# Patient Record
Sex: Male | Born: 1975 | Race: Black or African American | Hispanic: No | Marital: Married | Smoking: Never smoker
Health system: Southern US, Community
[De-identification: ages and names within clinical notes are randomized; demographics above are authoritative.]

## PROBLEM LIST (undated history)

## (undated) ENCOUNTER — Emergency Department (HOSPITAL_COMMUNITY): Disposition: A | Discharge status: Home / Self Care | Payer: Self-pay

## (undated) DIAGNOSIS — R358 Other polyuria: Secondary | ICD-10-CM

## (undated) DIAGNOSIS — M25579 Pain in unspecified ankle and joints of unspecified foot: Secondary | ICD-10-CM

## (undated) DIAGNOSIS — I4510 Unspecified right bundle-branch block: Secondary | ICD-10-CM

## (undated) DIAGNOSIS — M25571 Pain in right ankle and joints of right foot: Secondary | ICD-10-CM

## (undated) DIAGNOSIS — S92109A Unspecified fracture of unspecified talus, initial encounter for closed fracture: Secondary | ICD-10-CM

## (undated) HISTORY — DX: Unspecified right bundle-branch block: I45.10

---
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
## 2002-12-12 ENCOUNTER — Encounter: Admission: RE | Admit: 2002-12-12 | Discharge: 2002-12-12 | Payer: Self-pay | Admitting: Sports Medicine

## 2005-02-15 ENCOUNTER — Ambulatory Visit: Payer: Self-pay | Admitting: Sports Medicine

## 2008-02-08 ENCOUNTER — Ambulatory Visit: Payer: Self-pay | Admitting: Sports Medicine

## 2008-02-08 DIAGNOSIS — M25579 Pain in unspecified ankle and joints of unspecified foot: Secondary | ICD-10-CM

## 2008-02-08 DIAGNOSIS — S92109A Unspecified fracture of unspecified talus, initial encounter for closed fracture: Secondary | ICD-10-CM

## 2008-02-08 HISTORY — DX: Unspecified fracture of unspecified talus, initial encounter for closed fracture: S92.109A

## 2008-02-08 HISTORY — DX: Pain in unspecified ankle and joints of unspecified foot: M25.579

## 2010-01-06 ENCOUNTER — Ambulatory Visit: Payer: Self-pay | Admitting: Family Medicine

## 2010-01-06 DIAGNOSIS — R3589 Other polyuria: Secondary | ICD-10-CM

## 2010-01-06 DIAGNOSIS — R358 Other polyuria: Secondary | ICD-10-CM | POA: Insufficient documentation

## 2010-01-06 HISTORY — DX: Other polyuria: R35.89

## 2010-04-20 NOTE — Assessment & Plan Note (Signed)
Summary: NP,TCB   Vital Signs:  Patient profile:   35 year old male Height:      67.3 inches Weight:      206 pounds BMI:     32.09 Temp:     98.1 degrees F oral Pulse rate:   56 / minute Pulse rhythm:   regular BP sitting:   130 / 82  (left arm) Cuff size:   large  Vitals Entered By: Loralee Pacas CMA (January 06, 2010 2:42 PM) CC: NEW PATIENT physical   CC:  NEW PATIENT physical.  History of Present Illness: Hasn't seen MD in years.   Generally healthy and no complaints. Has not had cholesterol checked.  Also concerned for pos FHx of DM and he has some polyuria  Habits & Providers  Alcohol-Tobacco-Diet     Alcohol drinks/day: <1     Tobacco Status: never     Diet Comments: healthy     Diet Counseling: not indicated; diet is assessed to be healthy  Exercise-Depression-Behavior     Does Patient Exercise: no     Exercise Counseling: to improve exercise regimen     Type of exercise: strength and cardio     Times/week: <3     Have you felt down or hopeless? no     Have you felt little pleasure in things? no     Depression Counseling: not indicated; screening negative for depression     STD Risk: never     Drug Use: never     Seat Belt Use: always     Sun Exposure: infrequent  Current Medications (verified): 1)  Loratadine 10 Mg  Tabs (Loratadine) .... Once Daily As Needed Allergies Otc  Allergies (verified): No Known Drug Allergies  Past History:  Family History: Last updated: 01/06/2010 Family History Diabetes 1st degree relative lupus mother - for HBP, CAD, CVA  Social History: Last updated: 01/06/2010 Works at [employer]  Risk Factors: Alcohol Use: <1 (01/06/2010) Diet: healthy (01/06/2010) Exercise: no (01/06/2010)  Risk Factors: Smoking Status: never (01/06/2010)  Past Medical History: No significant previous medical problems.  Past Surgical History: none  Family History: Family History Diabetes 1st degree relative lupus  mother - for HBP, CAD, CVA  Social History: Works at [employer] Status:  never Does Patient Exercise:  no STD Risk:  never Drug Use:  never Risk analyst Use:  always Sun Exposure-Excessive:  infrequent  Review of Systems  The patient denies chest pain, syncope, dyspnea on exertion, peripheral edema, prolonged cough, headaches, abdominal pain, melena, severe indigestion/heartburn, suspicious skin lesions, depression, and unusual weight change.    Physical Exam  General:  Well-developed,well-nourished,in no acute distress; alert,appropriate and cooperative throughout examination Mouth:  Oral mucosa and oropharynx without lesions or exudates.  Teeth in good repair. Neck:  No deformities, masses, or tenderness noted. Lungs:  Normal respiratory effort, chest expands symmetrically. Lungs are clear to auscultation, no crackles or wheezes. Heart:  Normal rate and regular rhythm. S1 and S2 normal without gallop, murmur, click, rub or other extra sounds. Abdomen:  Bowel sounds positive,abdomen soft and non-tender without masses, organomegaly or hernias noted. Msk:  No deformity or scoliosis noted of thoracic or lumbar spine.   Extremities:  No clubbing, cyanosis, edema, or deformity noted with normal full range of motion of all joints.   Neurologic:  No cranial nerve deficits noted. Station and gait are normal. Plantar reflexes are down-going bilaterally. DTRs are symmetrical throughout. Sensory, motor and coordinative functions appear intact.  Skin:  Intact without suspicious lesions or rashes   Impression & Recommendations:  Problem # 1:  Preventive Health Care (ICD-V70.0) Healthy patient with no serious family history and no problematic life style issues.  BMI of 32 is high, so weight loss is the major recommendation.  BP checks q6 month.  Problem # 2:  SCREENING FOR LIPOID DISORDERS (ICD-V77.91) Will check FLP Future Orders: Lipid-[facility] ([id number]) ...  12/23/2010  Problem # 3:  POLYURIA (ZOX-096.04)  Future Orders: Basic Met-[facility] (54098-11914) ... 12/23/2010  Complete Medication List: 1)  Loratadine 10 Mg Tabs (Loratadine) .... Once daily as needed allergies otc Prescriptions: LORATADINE 10 MG  TABS (LORATADINE) once daily as needed allergies OTC  #30 x 12   Entered and Authorized by:   Doralee Albino MD   Signed by:   Doralee Albino MD on 01/06/2010   Method used:   Historical   RxID:   [id number]    Orders Added: 1)  Lipid-[facility] [[id number]] 2)  Basic Met-[facility] [[id number]] 3)  [facility]- New 18-74yrs [[id number]]     Prevention & Chronic Care Immunizations   Influenza vaccine: Not documented    Tetanus booster: Not documented    Pneumococcal vaccine: Not documented  Other Screening   Smoking status: never  (01/06/2010)  Lipids   Total Cholesterol: Not documented   LDL: Not documented   LDL Direct: Not documented   HDL: Not documented   Triglycerides: Not documented

## 2013-12-20 ENCOUNTER — Encounter (INDEPENDENT_AMBULATORY_CARE_PROVIDER_SITE_OTHER): Payer: Self-pay

## 2013-12-20 ENCOUNTER — Ambulatory Visit (INDEPENDENT_AMBULATORY_CARE_PROVIDER_SITE_OTHER): Payer: 59 | Admitting: Family Medicine

## 2013-12-20 ENCOUNTER — Ambulatory Visit (HOSPITAL_BASED_OUTPATIENT_CLINIC_OR_DEPARTMENT_OTHER)
Admission: RE | Admit: 2013-12-20 | Discharge: 2013-12-20 | Disposition: A | Discharge status: Home / Self Care | Payer: 59 | Source: Ambulatory Visit | Attending: Family Medicine | Admitting: Family Medicine

## 2013-12-20 ENCOUNTER — Encounter: Payer: Self-pay | Admitting: Family Medicine

## 2013-12-20 VITALS — BP 135/88 | HR 47 | Ht 67.0 in | Wt 202.0 lb

## 2013-12-20 DIAGNOSIS — M25571 Pain in right ankle and joints of right foot: Secondary | ICD-10-CM

## 2013-12-20 DIAGNOSIS — M25471 Effusion, right ankle: Secondary | ICD-10-CM | POA: Diagnosis not present

## 2013-12-20 NOTE — Patient Instructions (Signed)
Your pain and location are unusual. It does look like you have an avulsion fracture here but it appears old - there's no swelling over the area of the fracture or increased blood flow to suggest this just happened. These can be aggravated by activity occasionally. I would treat conservatively for 2 weeks Ice the area for 15 minutes at a time, 3-4 times a day Aleve 2 tabs twice a day with food OR ibuprofen 3 tabs three times a day with food for pain and inflammation. Elevate above the level of your heart when possible if swollen. Crutches if needed to help with walking Consider laceup brace or short boot when up and walking around. Follow up with me in 2 weeks for reevaluation.

## 2013-12-23 ENCOUNTER — Encounter: Payer: Self-pay | Admitting: Family Medicine

## 2013-12-23 DIAGNOSIS — M25571 Pain in right ankle and joints of right foot: Secondary | ICD-10-CM

## 2013-12-23 HISTORY — DX: Pain in right ankle and joints of right foot: M25.571

## 2013-12-23 NOTE — Assessment & Plan Note (Signed)
radiographs show avulsion fracture but this appears to be old.  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  No evidence gout, stress fracture.  Start with conservative treatment - icing, nsaids, elevation, avoiding running.  Short cam walker.  F/u in 2 weeks for reevaluation.

## 2013-12-23 NOTE — Progress Notes (Signed)
Patient ID: Roy CoddingtonDavid Stanley, male   DOB: 04-Jul-1975, 38 y.o.   MRN: [id number]  PCP: No primary provider on file.  Subjective:   HPI: Patient is a 38 y.o. male here for right ankle pain.  Patient denies known injury though has history of multiple ankle sprains past 20 years. States he ran on Saturday 10 miles - Sunday woke up with medial pain, swelling in right ankle. Still having pain though swelling has improved. Has been icing, took ibuprofen. No history of gout. No redness, warmth. No pain following his run or during his run on Saturday.  History reviewed. No pertinent past medical history.  Current Outpatient Prescriptions on File Prior to Visit  Medication Sig Dispense Refill  . loratadine (CLARITIN) 10 MG tablet Take 10 mg by mouth daily as needed. For allergies. OTC.        No current facility-administered medications on file prior to visit.    History reviewed. No pertinent past surgical history.  No Known Allergies  History   Social History  . Marital Status: Married    Spouse Name: N/A    Number of Children: N/A  . Years of Education: N/A   Occupational History  . Not on file.   Social History Main Topics  . Smoking status: Never Smoker   . Smokeless tobacco: Not on file  . Alcohol Use: Not on file  . Drug Use: Not on file  . Sexual Activity: Not on file   Other Topics Concern  . Not on file   Social History Narrative  . No narrative on file    No family history on file.  BP 135/88  Pulse 47  Ht 5\' 7"  (1.702 m)  Wt 202 lb (91.627 kg)  BMI 31.63 kg/m2  Review of Systems: See HPI above.    Objective:  Physical Exam:  Gen: NAD  Right ankle/foot: No gross deformity, swelling, ecchymoses FROM TTP mildly anteromedial ankle joint over anterior aspect deltoid ligament also.  No focal bony tenderness of malleoli, base 5th, navicular. Negative talar tilt, reverse talar tilt, ant drawer. Negative syndesmotic compression. Thompsons test  negative. NV intact distally.    Assessment & Plan:  1. Right ankle pain - radiographs show avulsion fracture but this appears to be old.  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  No evidence gout, stress fracture.  Start with conservative treatment - icing, nsaids, elevation, avoiding running.  Short cam walker.  F/u in 2 weeks for reevaluation.

## 2014-01-03 ENCOUNTER — Encounter: Payer: Self-pay | Admitting: Family Medicine

## 2014-01-03 ENCOUNTER — Ambulatory Visit (INDEPENDENT_AMBULATORY_CARE_PROVIDER_SITE_OTHER): Payer: 59 | Admitting: Family Medicine

## 2014-01-03 VITALS — BP 127/82 | HR 51 | Ht 67.0 in | Wt 200.0 lb

## 2014-01-03 DIAGNOSIS — M25571 Pain in right ankle and joints of right foot: Secondary | ICD-10-CM

## 2014-01-07 ENCOUNTER — Encounter: Payer: Self-pay | Admitting: Family Medicine

## 2014-01-07 NOTE — Progress Notes (Signed)
Patient ID: Joanie CoddingtonDavid Bastin, male   DOB: [birth year], 38 y.o.   MRN: [id number]  PCP: No primary provider on file.  Subjective:   HPI: Patient is a 38 y.o. male here for right ankle pain.  10/2: Patient denies known injury though has history of multiple ankle sprains past 20 years. States he ran on Saturday 10 miles - Sunday woke up with medial pain, swelling in right ankle. Still having pain though swelling has improved. Has been icing, took ibuprofen. No history of gout. No redness, warmth. No pain following his run or during his run on Saturday.  10/16: Patient reports he feels over 90% improved compared to last visit. A little pain but no worse than 2/10. Stopped using boot a few days ago. No swelling or bruising. No new injuries.  History reviewed. No pertinent past medical history.  Current Outpatient Prescriptions on File Prior to Visit  Medication Sig Dispense Refill  . loratadine (CLARITIN) 10 MG tablet Take 10 mg by mouth daily as needed. For allergies. OTC.        No current facility-administered medications on file prior to visit.    History reviewed. No pertinent past surgical history.  No Known Allergies  History   Social History  . Marital Status: Married    Spouse Name: N/A    Number of Children: N/A  . Years of Education: N/A   Occupational History  . Not on file.   Social History Main Topics  . Smoking status: Never Smoker   . Smokeless tobacco: Not on file  . Alcohol Use: Not on file  . Drug Use: Not on file  . Sexual Activity: Not on file   Other Topics Concern  . Not on file   Social History Narrative  . No narrative on file    No family history on file.  BP 127/82  Pulse 51  Ht 5\' 7"  (1.702 m)  Wt 200 lb (90.719 kg)  BMI 31.32 kg/m2  Review of Systems: See HPI above.    Objective:  Physical Exam:  Gen: NAD  Right ankle/foot: No gross deformity, swelling, ecchymoses FROM No longer with TTP anteromedial ankle joint over  anterior aspect deltoid ligament.  No focal bony tenderness of malleoli, base 5th, navicular. Negative talar tilt, reverse talar tilt, ant drawer. Negative syndesmotic compression. Thompsons test negative. NV intact distally.    Assessment & Plan:  1. Right ankle pain - radiographs showed avulsion fracture but this appears to be old.  Most consistent with flare of underlying arthropathy he has here.  No evidence gout, stress fracture.  Over 90% improved - discussed progression back to sports, activities.  Icing, nsaids, elevation if needed.  F/u prn.

## 2014-01-07 NOTE — Assessment & Plan Note (Signed)
radiographs showed avulsion fracture but this appears to be old.  Most consistent with flare of underlying arthropathy he has here.  No evidence gout, stress fracture.  Over 90% improved - discussed progression back to sports, activities.  Icing, nsaids, elevation if needed.  F/u prn.

## 2014-01-12 ENCOUNTER — Emergency Department (HOSPITAL_COMMUNITY)
Admission: EM | Admit: 2014-01-12 | Discharge: 2014-01-12 | Disposition: A | Discharge status: Home / Self Care | Payer: 59 | Source: Home / Self Care | Attending: Family Medicine | Admitting: Family Medicine

## 2014-01-12 ENCOUNTER — Encounter (HOSPITAL_COMMUNITY): Payer: Self-pay | Admitting: Emergency Medicine

## 2014-01-12 DIAGNOSIS — L03112 Cellulitis of left axilla: Secondary | ICD-10-CM

## 2014-01-12 MED ORDER — MINOCYCLINE HCL 100 MG PO CAPS: 100.0000 mg | ORAL_CAPSULE | Freq: Two times a day (BID) | ORAL | Status: DC

## 2014-01-12 NOTE — ED Provider Notes (Signed)
CSN: [id number]     Arrival date & time 01/12/14  1041 History   First MD Initiated Contact with Patient 01/12/14 1100     Chief Complaint  Patient presents with  . Arm Pain   (Consider location/radiation/quality/duration/timing/severity/associated sxs/prior Treatment) Patient is a 38 y.o. male presenting with arm pain. The history is provided by the patient.  Arm Pain This is a new problem. The current episode started 2 days ago (left axillary soreness, spreading to pectoral chest and inner arm.). The problem has been gradually improving. Pertinent negatives include no chest pain and no abdominal pain.    History reviewed. No pertinent past medical history. History reviewed. No pertinent past surgical history. History reviewed. No pertinent family history. History  Substance Use Topics  . Smoking status: Never Smoker   . Smokeless tobacco: Not on file  . Alcohol Use: No    Review of Systems  Constitutional: Negative.   Cardiovascular: Negative for chest pain.  Gastrointestinal: Negative for abdominal pain.  Skin: Positive for rash.    Allergies  Review of patient's allergies indicates no known allergies.  Home Medications   Prior to Admission medications   Medication Sig Start Date End Date Taking? Authorizing Provider  loratadine (CLARITIN) 10 MG tablet Take 10 mg by mouth daily as needed. For allergies. OTC.     Historical Provider, MD  minocycline (MINOCIN,DYNACIN) 100 MG capsule Take 1 capsule (100 mg total) by mouth 2 (two) times daily. 01/12/14   Linna HoffJames D Saul Dorsi, MD   BP 115/72  Pulse 59  Temp(Src) 97.6 F (36.4 C) (Oral)  Resp 12  SpO2 100% Physical Exam  Nursing note and vitals reviewed. Constitutional: He is oriented to person, place, and time. He appears well-developed and well-nourished. No distress.  Neurological: He is alert and oriented to person, place, and time.  Skin: Skin is warm and dry. Rash noted. There is erythema.  Mild left axillary skin sts  and tenderness and erythema. No discrete rash evident.    ED Course  Procedures (including critical care time) Labs Review Labs Reviewed - No data to display  Imaging Review No results found.   MDM   1. Cellulitis of left axilla        Linna HoffJames D Jae Skeet, MD 01/12/14 1122

## 2014-01-12 NOTE — ED Notes (Signed)
Reports having pain in left axillary, acute on set.  States by Saturday pain radiating down left arm and noticed bumps under left arm.  Denies chest pain or sob.

## 2014-01-12 NOTE — Discharge Instructions (Signed)
Warm compress twice a day when you take the antibiotic, take all of medicine, return as needed. °

## 2017-05-12 ENCOUNTER — Encounter: Payer: Self-pay | Admitting: Internal Medicine

## 2018-11-01 ENCOUNTER — Telehealth (HOSPITAL_COMMUNITY): Payer: Self-pay | Admitting: Radiology

## 2018-11-01 ENCOUNTER — Ambulatory Visit (HOSPITAL_COMMUNITY): Payer: 59 | Attending: Cardiovascular Disease

## 2018-11-01 ENCOUNTER — Other Ambulatory Visit: Payer: Self-pay

## 2018-11-01 ENCOUNTER — Other Ambulatory Visit (HOSPITAL_COMMUNITY): Payer: Self-pay | Admitting: Family Medicine

## 2018-11-01 ENCOUNTER — Encounter (INDEPENDENT_AMBULATORY_CARE_PROVIDER_SITE_OTHER): Payer: Self-pay

## 2018-11-01 DIAGNOSIS — R9431 Abnormal electrocardiogram [ECG] [EKG]: Secondary | ICD-10-CM

## 2018-11-01 DIAGNOSIS — I4510 Unspecified right bundle-branch block: Secondary | ICD-10-CM | POA: Diagnosis present

## 2018-11-01 DIAGNOSIS — R6889 Other general symptoms and signs: Secondary | ICD-10-CM

## 2018-11-01 NOTE — Telephone Encounter (Signed)
Unable to leave detail message due to [facility]. Left message to call back. Patient needs to schedule echocardiogram. Urgent per ordering physician.

## 2018-11-06 ENCOUNTER — Encounter: Payer: Self-pay | Admitting: *Deleted

## 2018-11-06 ENCOUNTER — Other Ambulatory Visit: Payer: Self-pay

## 2018-11-06 ENCOUNTER — Encounter: Payer: Self-pay | Admitting: Cardiology

## 2018-11-06 ENCOUNTER — Ambulatory Visit: Payer: 59 | Admitting: Cardiology

## 2018-11-06 VITALS — BP 120/72 | HR 55 | Ht 67.0 in | Wt 212.4 lb

## 2018-11-06 DIAGNOSIS — R0609 Other forms of dyspnea: Secondary | ICD-10-CM

## 2018-11-06 DIAGNOSIS — I4510 Unspecified right bundle-branch block: Secondary | ICD-10-CM

## 2018-11-06 DIAGNOSIS — R9431 Abnormal electrocardiogram [ECG] [EKG]: Secondary | ICD-10-CM | POA: Diagnosis not present

## 2018-11-06 DIAGNOSIS — I209 Angina pectoris, unspecified: Secondary | ICD-10-CM

## 2018-11-06 DIAGNOSIS — Z01812 Encounter for preprocedural laboratory examination: Secondary | ICD-10-CM | POA: Diagnosis not present

## 2018-11-06 MED ORDER — METOPROLOL TARTRATE 100 MG PO TABS: 100.0000 mg | ORAL_TABLET | Freq: Once | ORAL | 0 refills | Status: AC

## 2018-11-06 NOTE — Progress Notes (Signed)
Cardiology Office Note:    Date:  11/06/2018   ID:  Roy [place], DOB 10/16/1975, MRN [id number]  PCP:  Glenis Smoker, MD  Cardiologist:  No primary care provider on file.  Electrophysiologist:  None   Referring MD: Glenis Smoker, *   Here for the evaluation of abnormal EKG at the request of Dr. Lindell Noe  History of Present Illness:    Roy [place] is a 43 y.o. male with a hx of right bundle branch block here for the evaluation of abnormal EKG, right bundle branch block.  Also has been having decreased exercise tolerance.  An echocardiogram was performed on 11/01/2018 with the following results:   1. The left ventricle has normal systolic function, with an ejection fraction of 55-60%. The cavity size was normal. Left ventricular diastolic parameters were normal.  2. Normal GLS -18.8.  3. The right ventricle has normal systolic function. The cavity was normal. There is no increase in right ventricular wall thickness.  4. Left atrial size was mildly dilated.  5. Mild thickening of the mitral valve leaflet.  6. The aortic valve is tricuspid. Mild thickening of the aortic valve.  7. The aorta is normal unless otherwise noted.  He had an EKG performed at [facility],.  He was found to have a right bundle branch block.  He and his wife are both making lifestyle changes.  EKG was read out as having ischemia.  He has a family history of paternal grandmother having myocardial infarction at age 49.  Both his brother and sister are healthy.  He has no prior history of coronary disease or heart concerns in the past.  He has not been told that he had any murmurs.  He usually runs for exercise but got sick at the end of December with sore throat shortness of breath headaches no nausea COVID antigen 1 month ago was negative.  No documented fevers.  Wife also had a negative COVID antibody test after giving blood.  He then decided to not run as much during  these quarantine months.  Used to run about 8 to 10 miles but now after 18 to 22 minutes feels completely exhausted.  Has been experiencing chronic fatigue over the last 3 months.  Lots of work-related stress as well.  He was a high school and college athlete.  Even a flight of stairs causes him to be winded.  Does not feel any significant palpitations.  At age 3 he was hospitalized for asthma but no issues until one allergy flare about 5 years ago.  Saw an allergist.  Works at the [employer]. Never smoked.  No diabetes.  Rare EtOH. Had some lab work performed at [facility] was 1 ALT 16 albumin 4.5 Tsh 1.13 C-reactive protein 1 creatinine 1.3 on 10/31/2018, sodium 140 potassium 4.0 hemoglobin 14.5 platelets 250 He was referred for further cardiac evaluation.  Past Medical History:  Diagnosis Date  . Closed fracture of astragalus 02/08/2008   Qualifier: Diagnosis of  By: Oneida Alar MD, KARL    . Pain in joint, ankle and foot 02/08/2008   Centricity Description: ANKLE PAIN, LEFT Qualifier: Diagnosis of  By: Oneida Alar MD, KARL   Centricity Description: ANKLE PAIN Qualifier: Diagnosis of  By: Laurance Flatten CMA, [place]    . Polyuria 01/06/2010   Qualifier: Diagnosis of  By: Andria Frames MD, Gwyndolyn Saxon    . RBBB   . Right ankle pain 12/23/2013    History reviewed. No pertinent surgical history.  Current Medications: No outpatient medications have been marked as taking for the 11/06/18 encounter (Office Visit) with Jerline Pain, MD.     Allergies:   Patient has no known allergies.   Social History   Socioeconomic History  . Marital status: Married    Spouse name: Not on file  . Number of children: Not on file  . Years of education: Not on file  . Highest education level: Not on file  Occupational History  . Not on file  Social Needs  . Financial resource strain: Not on file  . Food insecurity    Worry: Not on file    Inability: Not on file  . Transportation needs    Medical: Not on file     Non-medical: Not on file  Tobacco Use  . Smoking status: Never Smoker  . Smokeless tobacco: Never Used  Substance and Sexual Activity  . Alcohol use: No  . Drug use: No  . Sexual activity: Yes    Birth control/protection: Condom  Lifestyle  . Physical activity    Days per week: Not on file    Minutes per session: Not on file  . Stress: Not on file  Relationships  . Social Herbalist on phone: Not on file    Gets together: Not on file    Attends religious service: Not on file    Active member of club or organization: Not on file    Attends meetings of clubs or organizations: Not on file    Relationship status: Not on file  Other Topics Concern  . Not on file  Social History Narrative  . Not on file     Family History: As described above in HPI.  ROS:   Please see the history of present illness.     All other systems reviewed and are negative.  EKGs/Labs/Other Studies Reviewed:    The following studies were reviewed today: Echocardiogram as above  EKG:  EKG is  ordered today.  The ekg ordered today demonstrates right bundle branch block heart rate 55 bpm with deep T wave inversions noted in V2 through V5  Recent Labs: No results found for requested labs within last 8760 hours.  Recent Lipid Panel No results found for: CHOL, TRIG, HDL, CHOLHDL, VLDL, LDLCALC, LDLDIRECT  Physical Exam:    VS:  BP 120/72   Pulse (!) 55   Ht '5\' 7"'$  (1.702 m)   Wt 212 lb 6.4 oz (96.3 kg)   SpO2 97%   BMI 33.27 kg/m     Wt Readings from Last 3 Encounters:  11/06/18 212 lb 6.4 oz (96.3 kg)  01/03/14 200 lb (90.7 kg)  12/20/13 202 lb (91.6 kg)     GEN:  Well nourished, well developed in no acute distress HEENT: Normal NECK: No JVD; No carotid bruits LYMPHATICS: No lymphadenopathy CARDIAC: RRR, no murmurs, rubs, gallops RESPIRATORY:  Clear to auscultation without rales, wheezing or rhonchi  ABDOMEN: Soft, non-tender, non-distended MUSCULOSKELETAL:  No edema; No  deformity  SKIN: Warm and dry NEUROLOGIC:  Alert and oriented x 3 PSYCHIATRIC:  Normal affect   ASSESSMENT:    1. Dyspnea on exertion   2. Nonspecific abnormal electrocardiogram (ECG) (EKG)   3. Right bundle branch block   4. Pre-procedural laboratory examinations   5. Angina pectoris ([place])    PLAN:    In order of problems listed above:  Right bundle branch block is noted V2 through V5. - Echocardiogram reassuring with normal  ejection fraction.  No significant valvular abnormalities.  Right bundle branch block on its own does not portend a worsened prognosis.  Decreased exercise tolerance/dyspnea on exertion, possible anginal equivalent, T wave inversion on EKG possible ischemia - Seems to be fairly significant over the last few months.  Chronic fatigue as well.  He did experience a COVID-like illness however his antibodies were negative. - I think would be helpful for [place] to check his coronary anatomy with a coronary CT scan with possible FFR analysis if necessary.  This will also give [place] a look at his lung parenchyma as well.   Medication Adjustments/Labs and Tests Ordered: Current medicines are reviewed at length with the patient today.  Concerns regarding medicines are outlined above.  Orders Placed This Encounter  Procedures  . CT CORONARY MORPH W/CTA COR W/SCORE W/CA W/CM &/OR WO/CM  . CT CORONARY FRACTIONAL FLOW RESERVE DATA PREP  . CT CORONARY FRACTIONAL FLOW RESERVE FLUID ANALYSIS  . Basic metabolic panel  . EKG 12-Lead   Meds ordered this encounter  Medications  . metoprolol tartrate (LOPRESSOR) 100 MG tablet    Sig: Take 1 tablet (100 mg total) by mouth once for 1 dose.    Dispense:  1 tablet    Refill:  0    Patient Instructions  Medication Instructions:  The current medical regimen is effective;  continue present plan and medications.  If you need a refill on your cardiac medications before your next appointment, please call your pharmacy.   Lab work: You  will need lab work prior to your CT.  (BMP) If you have labs (blood work) drawn today and your tests are completely normal, you will receive your results only by: Marland Kitchen MyChart Message (if you have MyChart) OR . A paper copy in the mail If you have any lab test that is abnormal or we need to change your treatment, we will call you to review the results.  Testing/Procedures: Your physician has requested that you have Coronary CT. Cardiac computed tomography (CT) is a painless test that uses an x-ray machine to take clear, detailed pictures of your heart.  Please follow instruction sheet as given.  Follow-Up: Follow up as needed after the above testing.  Thank you for choosing [facility]!!     Your cardiac CT will be scheduled at one of the below locations:   [facility] [address] [place], [place] [address] [phone]  Please arrive at the [facility] main entrance of [facility] 30-45 minutes prior to test start time. Proceed to the [facility] Radiology Department (first floor) to check-in and test prep.  Please follow these instructions carefully (unless otherwise directed):  Hold all erectile dysfunction medications at least 48 hours prior to test.  On the Night Before the Test: . Be sure to Drink plenty of water. . Do not consume any caffeinated/decaffeinated beverages or chocolate 12 hours prior to your test. . Do not take any antihistamines 12 hours prior to your test.  On the Day of the Test: . Drink plenty of water. Do not drink any water within one hour of the test. . Do not eat any food 4 hours prior to the test. . You may take your regular medications prior to the test.  . Take metoprolol (Lopressor) two hours prior to test.  If you heartrate is less than 55 bpm - do not take Lopressor.  If higher than 55 bpm take Lopressor 100 mg 2 hours  before your CT scan. Marland Kitchen HOLD Furosemide/Hydrochlorothiazide morning of the test.  After the  Test: . Drink plenty of water. . After receiving IV contrast, you may experience a mild flushed feeling. This is normal. . On occasion, you may experience a mild rash up to 24 hours after the test. This is not dangerous. If this occurs, you can take Benadryl 25 mg and increase your fluid intake. . If you experience trouble breathing, this can be serious. If it is severe call 911 IMMEDIATELY. If it is mild, please call our office. . If you take any of these medications: Glipizide/Metformin, Avandament, Glucavance, please do not take 48 hours after completing test.  Please contact the cardiac imaging nurse navigator should you have any questions/concerns Marchia Bond, RN Navigator Cardiac Imaging [facility] [phone] Office  [phone] Cell      Signed, Candee Furbish, MD  11/06/2018 9:33 AM    [place]

## 2018-11-06 NOTE — Patient Instructions (Addendum)
Medication Instructions:  The current medical regimen is effective;  continue present plan and medications.  If you need a refill on your cardiac medications before your next appointment, please call your pharmacy.   Lab work: You will need lab work prior to your CT.  (BMP) If you have labs (blood work) drawn today and your tests are completely normal, you will receive your results only by: Marland Kitchen MyChart Message (if you have MyChart) OR . A paper copy in the mail If you have any lab test that is abnormal or we need to change your treatment, we will call you to review the results.  Testing/Procedures: Your physician has requested that you have Coronary CT. Cardiac computed tomography (CT) is a painless test that uses an x-ray machine to take clear, detailed pictures of your heart.  Please follow instruction sheet as given.  Follow-Up: Follow up as needed after the above testing.  Thank you for choosing [facility]!!     Your cardiac CT will be scheduled at one of the below locations:   [facility] [address] [place], [place] [address] [phone]  Please arrive at the [facility] main entrance of [address] [facility] 30-45 minutes prior to test start time. Proceed to the [facility] Radiology Department (first floor) to check-in and test prep.  Please follow these instructions carefully (unless otherwise directed):  Hold all erectile dysfunction medications at least 48 hours prior to test.  On the Night Before the Test: . Be sure to Drink plenty of water. . Do not consume any caffeinated/decaffeinated beverages or chocolate 12 hours prior to your test. . Do not take any antihistamines 12 hours prior to your test.  On the Day of the Test: . Drink plenty of water. Do not drink any water within one hour of the test. . Do not eat any food 4 hours prior to the test. . You may take your regular medications prior to the test.  . Take metoprolol (Lopressor)  two hours prior to test.  If you heartrate is less than 55 bpm - do not take Lopressor.  If higher than 55 bpm take Lopressor 100 mg 2 hours before your CT scan. Marland Kitchen HOLD Furosemide/Hydrochlorothiazide morning of the test.  After the Test: . Drink plenty of water. . After receiving IV contrast, you may experience a mild flushed feeling. This is normal. . On occasion, you may experience a mild rash up to 24 hours after the test. This is not dangerous. If this occurs, you can take Benadryl 25 mg and increase your fluid intake. . If you experience trouble breathing, this can be serious. If it is severe call 911 IMMEDIATELY. If it is mild, please call our office. . If you take any of these medications: Glipizide/Metformin, Avandament, Glucavance, please do not take 48 hours after completing test.  Please contact the cardiac imaging nurse navigator should you have any questions/concerns Marchia Bond, RN Navigator Cardiac [place] and [facility] Services [phone] Office  [phone] Cell

## 2018-11-20 ENCOUNTER — Telehealth (HOSPITAL_COMMUNITY): Payer: Self-pay | Admitting: Emergency Medicine

## 2018-11-20 NOTE — Telephone Encounter (Signed)
Reaching out to patient to offer assistance regarding upcoming cardiac imaging study; pt verbalizes understanding of appt date/time, parking situation and where to check in, pre-test NPO status and medications ordered, and verified current allergies; name and call back number provided for further questions should they arise Nethaniel Mattie RN Navigator Cardiac Imaging [place] [facility] [phone] office [phone] cell 

## 2018-11-21 ENCOUNTER — Ambulatory Visit
Admission: RE | Admit: 2018-11-21 | Discharge: 2018-11-21 | Disposition: A | Discharge status: Home / Self Care | Payer: 59 | Source: Ambulatory Visit | Attending: Cardiology | Admitting: Cardiology

## 2018-11-21 ENCOUNTER — Other Ambulatory Visit: Payer: Self-pay

## 2018-11-21 DIAGNOSIS — I4510 Unspecified right bundle-branch block: Secondary | ICD-10-CM | POA: Insufficient documentation

## 2018-11-21 DIAGNOSIS — R0609 Other forms of dyspnea: Secondary | ICD-10-CM | POA: Insufficient documentation

## 2018-11-21 DIAGNOSIS — R9431 Abnormal electrocardiogram [ECG] [EKG]: Secondary | ICD-10-CM | POA: Insufficient documentation

## 2018-11-21 MED ORDER — NITROGLYCERIN 0.4 MG SL SUBL
0.8000 mg | SUBLINGUAL_TABLET | Freq: Once | SUBLINGUAL | Status: AC
  Administered 2018-11-21: 0.8 mg via SUBLINGUAL

## 2018-11-21 MED ORDER — IOHEXOL 350 MG/ML SOLN
75.0000 mL | Freq: Once | INTRAVENOUS | Status: AC | PRN
  Administered 2018-11-21: 75 mL via INTRAVENOUS

## 2018-11-21 NOTE — Progress Notes (Signed)
Patient ID: Roy [place], male   DOB: 1975-09-16, 43 y.o.   MRN: [id number]   Pt reports slight nausea post-test; denies dizziness or lightheadedness; offered refreshment, refused; pt VS stable, PIV removed without incident; pt ambulatory to lobby with steady gait noted

## 2018-11-27 ENCOUNTER — Ambulatory Visit (HOSPITAL_COMMUNITY): Payer: 59

## 2021-09-02 IMAGING — CT CT HEART MORP W/ CTA COR W/ SCORE W/ CA W/CM &/OR W/O CM
1 of 4 series · 13 of 20 positions shown, 17 images · non-contrast
Comparison: None.

Addendum:
CLINICAL DATA: 42-year-old male with h/o obesity and dyspnea on
exertion.

EXAM:
Cardiac/Coronary  CTA
TECHNIQUE: The patient was scanned on a [employer] Force scanner.

[Series 556: multi phase hr2 cta coronary · axial · 0.42mm/px · z∈[-1508,-1362]mm · 13 of 3726 slices shown, 17 images]
[im 233/3726  vessel]
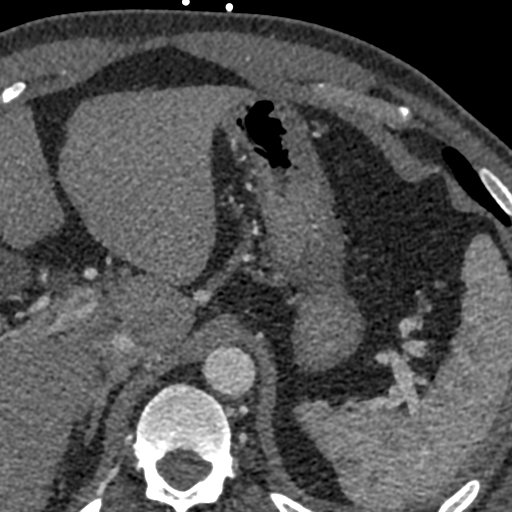
[im 233/3726  lung]
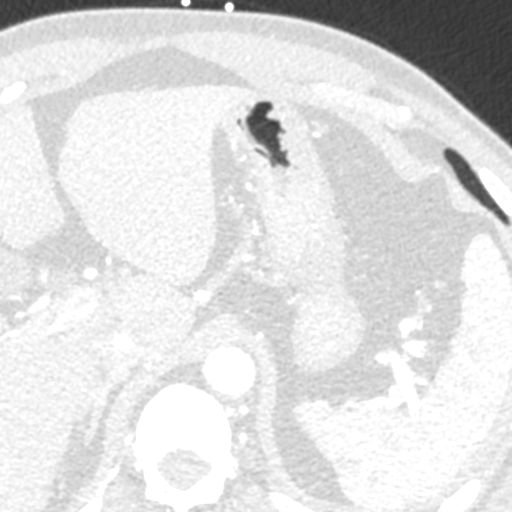
[im 466/3726  vessel]
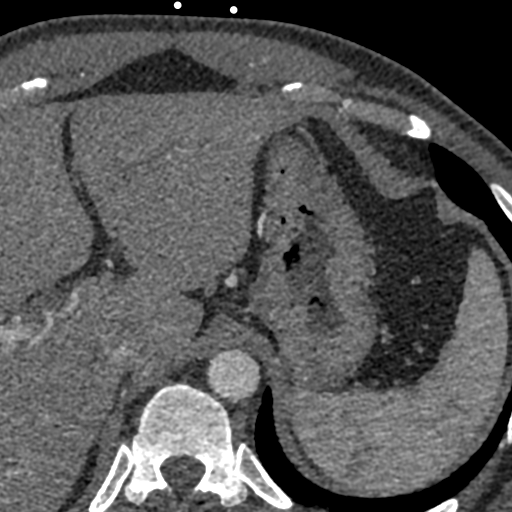
[im 699/3726  vessel]
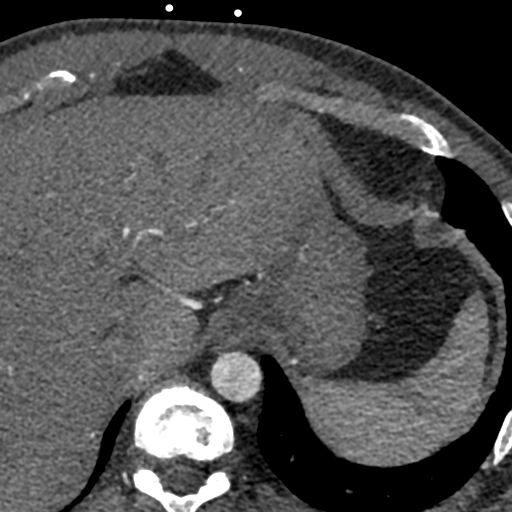
[im 1165/3726  vessel]
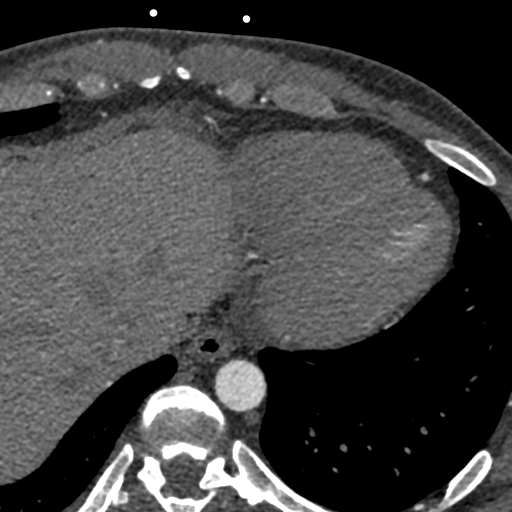
[im 1397/3726  vessel]
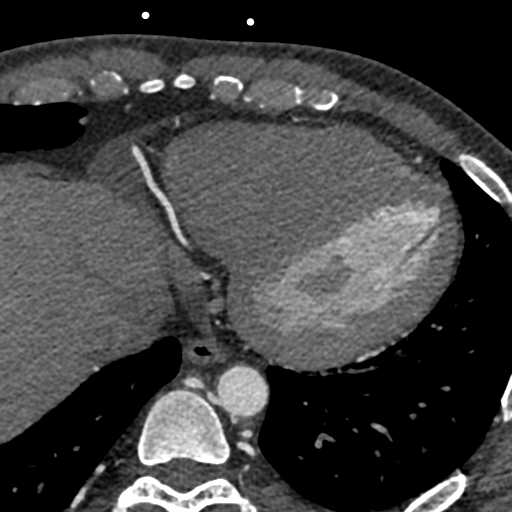
[im 1397/3726  lung]
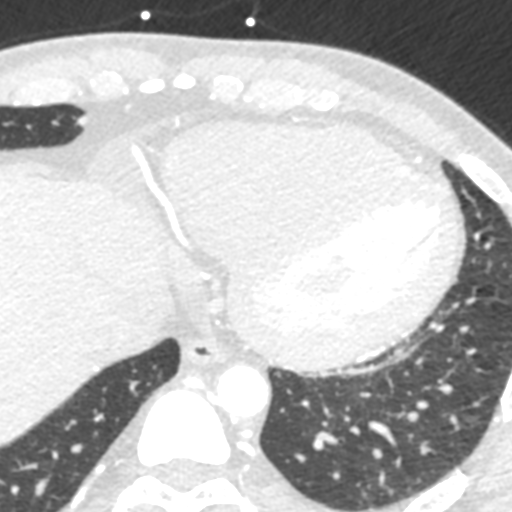
[im 1630/3726  vessel]
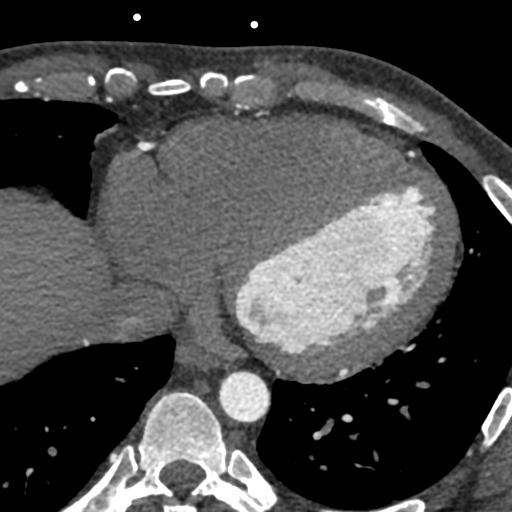
[im 1863/3726  vessel]
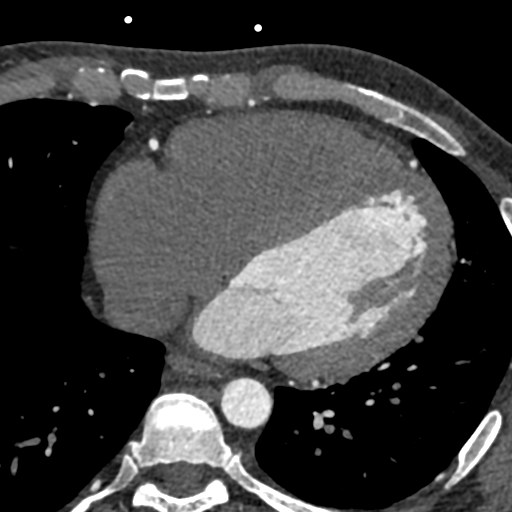
[im 2096/3726  vessel]
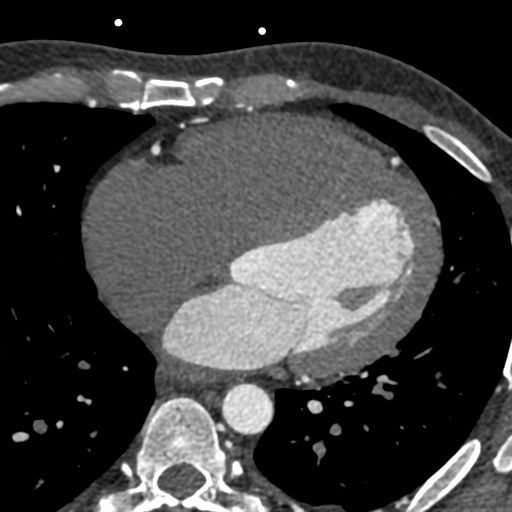
[im 2329/3726  vessel]
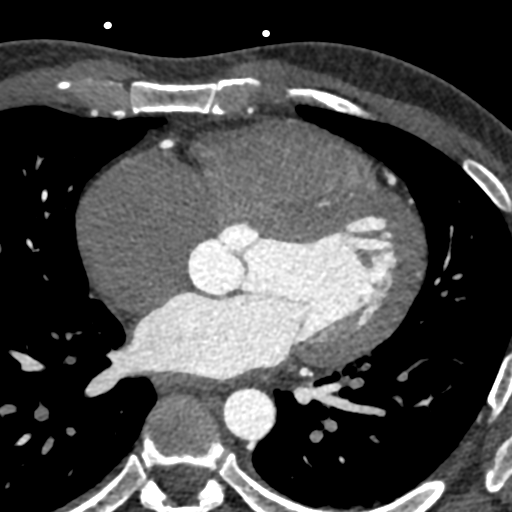
[im 2329/3726  lung]
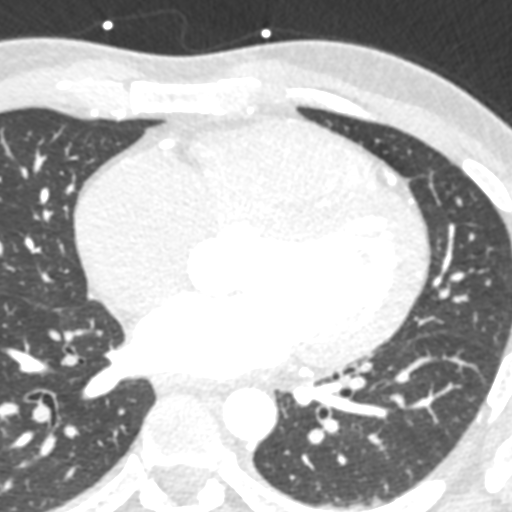
[im 2561/3726  vessel]
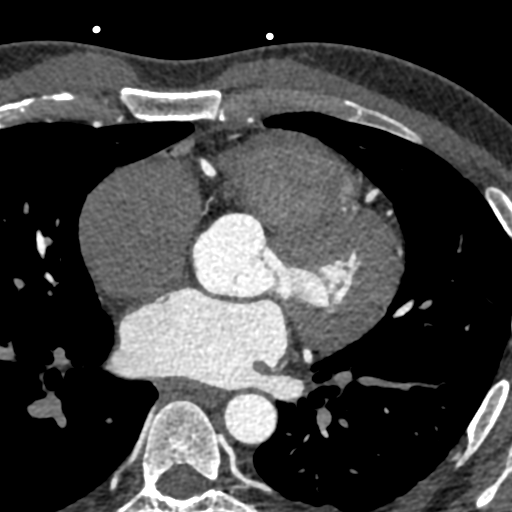
[im 3027/3726  vessel]
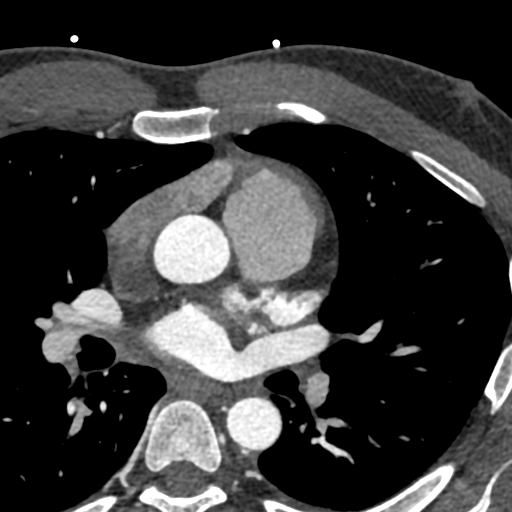
[im 3260/3726  vessel]
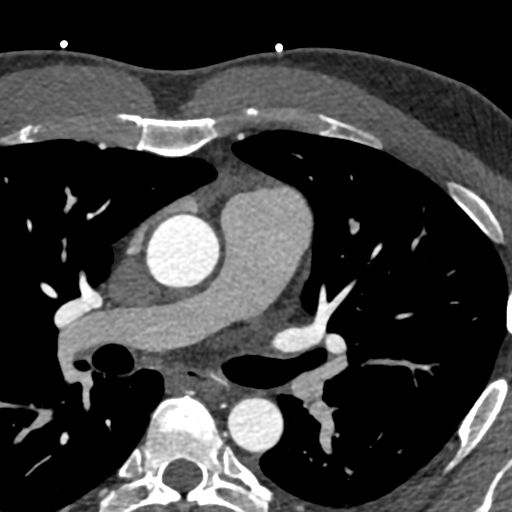
[im 3493/3726  vessel]
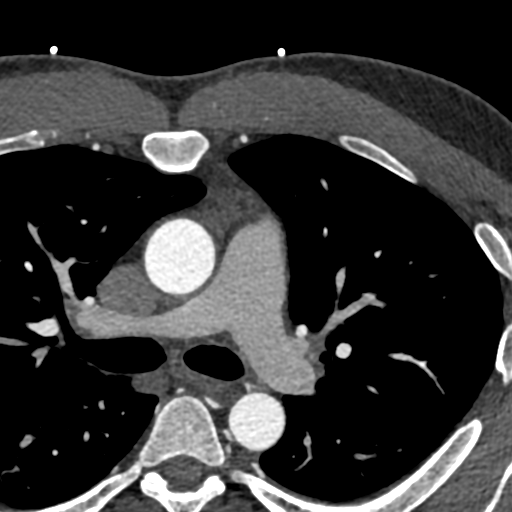
[im 3493/3726  lung]
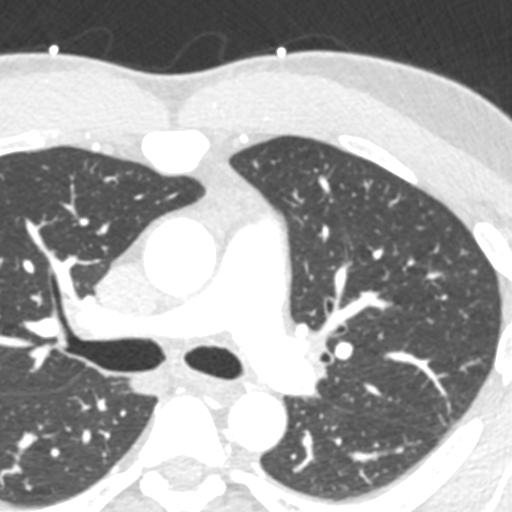

[13 of 20 positions shown; findings below may reference images not displayed]



Aorta:  Normal size.  No calcifications.  No dissection.

Aortic Valve:  Trileaflet.  No calcifications.

Coronary Arteries:  Normal coronary origin.  Right dominance.

RCA is a large dominant artery that gives rise to PDA and PLA. There
is no plaque.

Left main is a large and long artery that gives rise to LAD and LCX
arteries. Left main has no plaque.

LAD is a large vessel that gives rise to two small lumen diagonal
arteries and has no plaque.

LCX is a non-dominant artery that gives rise to one large OM1
branch. There is no plaque.

Other findings:

Normal pulmonary vein drainage into the left atrium.

Normal left atrial appendage without a thrombus.

Normal size of the pulmonary artery.
IMPRESSION: 1. Coronary calcium score of 0. This was 0 percentile for age and
sex matched control.

2. Normal coronary origin with right dominance.

3. No evidence of CAD. CAD-RADS 0. Consider non-atherosclerotic
causes of chest pain.

EXAM:
OVER-READ INTERPRETATION  CT CHEST

The following report is an over-read performed by radiologist Dr.
over-read does not include interpretation of cardiac or coronary
anatomy or pathology. The interpretation by the cardiologist is
attached.
FINDINGS: Visualized portions of the lungs, bones and upper abdomen are
unremarkable.
IMPRESSION: Unremarkable extracardiac structures.

*** End of Addendum ***
FINDINGS: A 100 kV prospective scan was triggered in the descending thoracic
aorta at 111 HU's. Axial non-contrast 3 mm slices were carried out
through the heart. The data set was analyzed on a dedicated work
station and scored using the Agatson method. Gantry rotation speed
was 250 msecs and collimation was .6 mm. No beta blockade and 0.8 mg
of sl NTG was given. The 3D data set was reconstructed in 5%
intervals of the 67-82 % of the R-R cycle. Diastolic phases were
analyzed on a dedicated work station using MPR, MIP and VRT modes.
The patient received 80 cc of contrast.

Aorta:  Normal size.  No calcifications.  No dissection.

Aortic Valve:  Trileaflet.  No calcifications.

Coronary Arteries:  Normal coronary origin.  Right dominance.

RCA is a large dominant artery that gives rise to PDA and PLA. There
is no plaque.

Left main is a large and long artery that gives rise to LAD and LCX
arteries. Left main has no plaque.

LAD is a large vessel that gives rise to two small lumen diagonal
arteries and has no plaque.

LCX is a non-dominant artery that gives rise to one large OM1
branch. There is no plaque.

Other findings:

Normal pulmonary vein drainage into the left atrium.

Normal left atrial appendage without a thrombus.

Normal size of the pulmonary artery.
IMPRESSION: 1. Coronary calcium score of 0. This was 0 percentile for age and
sex matched control.

2. Normal coronary origin with right dominance.

3. No evidence of CAD. CAD-RADS 0. Consider non-atherosclerotic
causes of chest pain.
